# Patient Record
Sex: Female | Born: 1955 | Race: Black or African American | Hispanic: No | Marital: Single | Smoking: Former smoker
Health system: Southern US, Community
[De-identification: ages and names within clinical notes are randomized; demographics above are authoritative.]

## PROBLEM LIST (undated history)

## (undated) DIAGNOSIS — I2510 Atherosclerotic heart disease of native coronary artery without angina pectoris: Secondary | ICD-10-CM

## (undated) DIAGNOSIS — E785 Hyperlipidemia, unspecified: Secondary | ICD-10-CM

## (undated) DIAGNOSIS — I10 Essential (primary) hypertension: Secondary | ICD-10-CM

## (undated) DIAGNOSIS — E079 Disorder of thyroid, unspecified: Secondary | ICD-10-CM

## (undated) DIAGNOSIS — I219 Acute myocardial infarction, unspecified: Secondary | ICD-10-CM

## (undated) HISTORY — PX: CARDIAC CATHETERIZATION: SHX172

## (undated) HISTORY — PX: ABDOMINAL HYSTERECTOMY: SHX81

---

## 2017-02-27 ENCOUNTER — Observation Stay (HOSPITAL_COMMUNITY)
Admission: EM | Admit: 2017-02-27 | Discharge: 2017-02-27 | Disposition: A | Discharge status: Home / Self Care | Payer: Medicare HMO | Attending: Internal Medicine | Admitting: Internal Medicine

## 2017-02-27 ENCOUNTER — Emergency Department (HOSPITAL_COMMUNITY): Payer: Medicare HMO

## 2017-02-27 ENCOUNTER — Encounter (HOSPITAL_COMMUNITY): Payer: Self-pay | Admitting: Emergency Medicine

## 2017-02-27 DIAGNOSIS — Z720 Tobacco use: Secondary | ICD-10-CM | POA: Diagnosis not present

## 2017-02-27 DIAGNOSIS — R011 Cardiac murmur, unspecified: Secondary | ICD-10-CM | POA: Diagnosis not present

## 2017-02-27 DIAGNOSIS — Z7984 Long term (current) use of oral hypoglycemic drugs: Secondary | ICD-10-CM | POA: Diagnosis not present

## 2017-02-27 DIAGNOSIS — Z7982 Long term (current) use of aspirin: Secondary | ICD-10-CM | POA: Insufficient documentation

## 2017-02-27 DIAGNOSIS — I25110 Atherosclerotic heart disease of native coronary artery with unstable angina pectoris: Secondary | ICD-10-CM

## 2017-02-27 DIAGNOSIS — D649 Anemia, unspecified: Secondary | ICD-10-CM

## 2017-02-27 DIAGNOSIS — E119 Type 2 diabetes mellitus without complications: Secondary | ICD-10-CM

## 2017-02-27 DIAGNOSIS — I252 Old myocardial infarction: Secondary | ICD-10-CM

## 2017-02-27 DIAGNOSIS — R0602 Shortness of breath: Secondary | ICD-10-CM | POA: Diagnosis not present

## 2017-02-27 DIAGNOSIS — Z79899 Other long term (current) drug therapy: Secondary | ICD-10-CM | POA: Diagnosis not present

## 2017-02-27 DIAGNOSIS — R079 Chest pain, unspecified: Principal | ICD-10-CM

## 2017-02-27 DIAGNOSIS — I209 Angina pectoris, unspecified: Secondary | ICD-10-CM

## 2017-02-27 DIAGNOSIS — Z885 Allergy status to narcotic agent status: Secondary | ICD-10-CM

## 2017-02-27 DIAGNOSIS — I2510 Atherosclerotic heart disease of native coronary artery without angina pectoris: Secondary | ICD-10-CM

## 2017-02-27 DIAGNOSIS — Z888 Allergy status to other drugs, medicaments and biological substances status: Secondary | ICD-10-CM

## 2017-02-27 DIAGNOSIS — I10 Essential (primary) hypertension: Secondary | ICD-10-CM

## 2017-02-27 DIAGNOSIS — Z8249 Family history of ischemic heart disease and other diseases of the circulatory system: Secondary | ICD-10-CM

## 2017-02-27 DIAGNOSIS — F17211 Nicotine dependence, cigarettes, in remission: Secondary | ICD-10-CM

## 2017-02-27 DIAGNOSIS — Z886 Allergy status to analgesic agent status: Secondary | ICD-10-CM

## 2017-02-27 DIAGNOSIS — E785 Hyperlipidemia, unspecified: Secondary | ICD-10-CM

## 2017-02-27 DIAGNOSIS — I249 Acute ischemic heart disease, unspecified: Secondary | ICD-10-CM | POA: Diagnosis not present

## 2017-02-27 HISTORY — DX: Essential (primary) hypertension: I10

## 2017-02-27 HISTORY — DX: Acute myocardial infarction, unspecified: I21.9

## 2017-02-27 HISTORY — DX: Hyperlipidemia, unspecified: E78.5

## 2017-02-27 HISTORY — DX: Disorder of thyroid, unspecified: E07.9

## 2017-02-27 HISTORY — DX: Atherosclerotic heart disease of native coronary artery without angina pectoris: I25.10

## 2017-02-27 LAB — CBC
HCT: 35.6 % — ABNORMAL LOW (ref 36.0–46.0)
Hemoglobin: 11.7 g/dL — ABNORMAL LOW (ref 12.0–15.0)
MCH: 29.3 pg (ref 26.0–34.0)
MCHC: 32.9 g/dL (ref 30.0–36.0)
MCV: 89.2 fL (ref 78.0–100.0)
PLATELETS: 243 10*3/uL (ref 150–400)
RBC: 3.99 MIL/uL (ref 3.87–5.11)
RDW: 12.6 % (ref 11.5–15.5)
WBC: 6.3 10*3/uL (ref 4.0–10.5)

## 2017-02-27 LAB — BASIC METABOLIC PANEL
Anion gap: 11 (ref 5–15)
BUN: 8 mg/dL (ref 6–20)
CALCIUM: 8.7 mg/dL — AB (ref 8.9–10.3)
CO2: 21 mmol/L — ABNORMAL LOW (ref 22–32)
CREATININE: 0.66 mg/dL (ref 0.44–1.00)
Chloride: 104 mmol/L (ref 101–111)
Glucose, Bld: 115 mg/dL — ABNORMAL HIGH (ref 65–99)
Potassium: 3.9 mmol/L (ref 3.5–5.1)
SODIUM: 136 mmol/L (ref 135–145)

## 2017-02-27 LAB — I-STAT TROPONIN, ED
TROPONIN I, POC: 0 ng/mL (ref 0.00–0.08)
TROPONIN I, POC: 0 ng/mL (ref 0.00–0.08)

## 2017-02-27 LAB — TROPONIN I

## 2017-02-27 LAB — CBG MONITORING, ED: GLUCOSE-CAPILLARY: 113 mg/dL — AB (ref 65–99)

## 2017-02-27 LAB — GLUCOSE, CAPILLARY: GLUCOSE-CAPILLARY: 153 mg/dL — AB (ref 65–99)

## 2017-02-27 MED ORDER — NITROGLYCERIN 2 % TD OINT
1.0000 [in_us] | TOPICAL_OINTMENT | Freq: Four times a day (QID) | TRANSDERMAL | Status: DC
  Administered 2017-02-27: 1 [in_us] via TOPICAL
  Filled 2017-02-27: qty 1

## 2017-02-27 MED ORDER — MORPHINE SULFATE (PF) 4 MG/ML IV SOLN
4.0000 mg | Freq: Once | INTRAVENOUS | Status: AC
  Administered 2017-02-27: 4 mg via INTRAVENOUS
  Filled 2017-02-27: qty 1

## 2017-02-27 MED ORDER — HEPARIN SODIUM (PORCINE) 5000 UNIT/ML IJ SOLN: 5000.0000 [IU] | Freq: Three times a day (TID) | INTRAMUSCULAR | Status: DC

## 2017-02-27 MED ORDER — IRBESARTAN 150 MG PO TABS: 150.0000 mg | ORAL_TABLET | Freq: Every day | ORAL | Status: DC

## 2017-02-27 MED ORDER — AMLODIPINE BESYLATE 5 MG PO TABS
5.0000 mg | ORAL_TABLET | Freq: Every day | ORAL | Status: DC
Start: 2017-02-28 — End: 2017-02-27

## 2017-02-27 MED ORDER — METOPROLOL SUCCINATE ER 50 MG PO TB24
50.0000 mg | ORAL_TABLET | Freq: Every day | ORAL | Status: DC
Start: 2017-02-28 — End: 2017-02-27

## 2017-02-27 MED ORDER — EZETIMIBE 10 MG PO TABS: 10.0000 mg | ORAL_TABLET | Freq: Every day | ORAL | Status: DC

## 2017-02-27 MED ORDER — VENLAFAXINE HCL ER 150 MG PO CP24: 225.0000 mg | ORAL_CAPSULE | Freq: Every day | ORAL | Status: DC

## 2017-02-27 MED ORDER — NITROGLYCERIN 0.4 MG SL SUBL: 0.4000 mg | SUBLINGUAL_TABLET | SUBLINGUAL | 0 refills | Status: AC | PRN

## 2017-02-27 MED ORDER — LEVOTHYROXINE SODIUM 88 MCG PO TABS: 88.0000 ug | ORAL_TABLET | Freq: Every day | ORAL | Status: DC

## 2017-02-27 MED ORDER — BUPROPION HCL ER (XL) 150 MG PO TB24: 300.0000 mg | ORAL_TABLET | Freq: Every day | ORAL | Status: DC

## 2017-02-27 NOTE — Progress Notes (Signed)
Seen by cards. Dr. Caron PresumeHelberg notified via text messaging.

## 2017-02-27 NOTE — ED Provider Notes (Signed)
[facility] EMERGENCY DEPARTMENT Provider Note   CSN: [id number] Arrival date & time: 02/27/17  0510     History   Chief Complaint Chief Complaint  Patient presents with  . Chest Pain    HPI Danielle Glass is a 61 y.o. female.  HPI  Patient comes in with chief complaint of chest pain. Patient has history of hyperlipidemia, hypertension, diabetes.  She reports a nonobstructive cath with coronary artery disease 2 years ago, done at [facility] in [place].  Patient reports that she woke up in the middle night with chest discomfort.  Patient's chest pain is described as heaviness type pain on the left side with radiation to the neck and shoulder.  Patient has had associated shortness of breath, and she was clammy.     Past Medical History:  Diagnosis Date  . Coronary artery disease   . Heart attack (HCC)   . Hyperlipidemia   . Hypertension   . Thyroid disease     There are no active problems to display for this patient.   Past Surgical History:  Procedure Laterality Date  . ABDOMINAL HYSTERECTOMY    . CARDIAC CATHETERIZATION      OB History    No data available       Home Medications    Prior to Admission medications   Medication Sig Start Date End Date Taking? Authorizing Provider  Alirocumab (PRALUENT) 150 MG/ML SOPN Inject 1 mL into the skin every 14 (fourteen) days.   Yes [provider]  amLODipine (NORVASC) 5 MG tablet Take 5 mg by mouth daily.   Yes [provider]  ammonium lactate (AMLACTIN) 12 % cream Apply 1 g topically daily as needed for dry skin.   Yes [provider]  aspirin EC 81 MG tablet Take 81 mg by mouth daily.   Yes [provider]  betamethasone valerate (VALISONE) 0.1 % cream Apply 1 application topically daily as needed (psoriasis).   Yes [provider]  buPROPion (WELLBUTRIN XL) 300 MG 24 hr tablet Take 300 mg by mouth daily.   Yes [provider]    candesartan (ATACAND) 16 MG tablet Take 16 mg by mouth daily.   Yes [provider]  Diclofenac Sodium (PENNSAID) 2 % SOLN Place 1 Pump onto the skin daily as needed (pain).   Yes [provider]  esomeprazole (NEXIUM) 40 MG capsule Take 40 mg by mouth daily at 12 noon.   Yes [provider]  estrogens, conjugated, (PREMARIN) 0.9 MG tablet Take 0.9 mg by mouth daily. Take daily for 21 days then do not take for 7 days.   Yes [provider]  ezetimibe (ZETIA) 10 MG tablet Take 10 mg by mouth daily.   Yes [provider]  levothyroxine (SYNTHROID, LEVOTHROID) 88 MCG tablet Take 88 mcg by mouth daily before breakfast.   Yes [provider]  metoprolol succinate (TOPROL-XL) 50 MG 24 hr tablet Take 50 mg by mouth daily. Take with or immediately following a meal.   Yes [provider]  pregabalin (LYRICA) 100 MG capsule Take 100 mg by mouth 4 (four) times daily.   Yes [provider]  tiZANidine (ZANAFLEX) 4 MG tablet Take 4 mg by mouth every 6 (six) hours as needed for muscle spasms.   Yes [provider]  venlafaxine XR (EFFEXOR-XR) 75 MG 24 hr capsule Take 225 mg by mouth daily with breakfast.   Yes [provider]    Family History  History reviewed. No pertinent family history.  Social History Social History   Tobacco Use  . Smoking status: Former Smoker    Last attempt to quit: 08/28/2016    Years since quitting: 0.5  . Smokeless tobacco: Never Used  Substance Use Topics  . Alcohol use: No    Frequency: Never  . Drug use: No     Allergies   Ace inhibitors; Statins; Asa [aspirin]; Codeine; Metformin and related; and Nsaids   Review of Systems Review of Systems  Constitutional: Positive for activity change and diaphoresis.  Respiratory: Positive for shortness of breath.   Cardiovascular: Positive for chest pain.  Gastrointestinal: Negative for abdominal pain.  Allergic/Immunologic: Negative  for immunocompromised state.     Physical Exam Updated Vital Signs BP (!) 155/79   Pulse 71   Temp 98 F (36.7 C) (Oral)   Resp 19   Ht 5\' 8"  (1.727 m)   Wt 77.1 kg (170 lb)   SpO2 98%   BMI 25.85 kg/m   Physical Exam  Constitutional: She is oriented to person, place, and time. She appears well-developed.  HENT:  Head: Normocephalic and atraumatic.  Eyes: EOM are normal.  Neck: Normal range of motion. Neck supple.  Cardiovascular: Normal rate, regular rhythm, intact distal pulses and normal pulses.  Pulmonary/Chest: Effort normal.  Abdominal: Bowel sounds are normal.  Musculoskeletal:       Right lower leg: She exhibits no tenderness and no edema.       Left lower leg: She exhibits no tenderness and no edema.  Neurological: She is alert and oriented to person, place, and time.  Skin: Skin is warm and dry.  Nursing note and vitals reviewed.    ED Treatments / Results  Labs (all labs ordered are listed, but only abnormal results are displayed) Labs Reviewed  BASIC METABOLIC PANEL - Abnormal; Notable for the following components:      Result Value   CO2 21 (*)    Glucose, Bld 115 (*)    Calcium 8.7 (*)    All other components within normal limits  CBC - Abnormal; Notable for the following components:   Hemoglobin 11.7 (*)    HCT 35.6 (*)    All other components within normal limits  CBG MONITORING, ED - Abnormal; Notable for the following components:   Glucose-Capillary 113 (*)    All other components within normal limits  I-STAT TROPONIN, ED    EKG  EKG Interpretation  Date/Time:  Tuesday February 27 2017 05:20:06 EST Ventricular Rate:  76 PR Interval:    QRS Duration: 83 QT Interval:  380 QTC Calculation: 428 R Axis:   58 Text Interpretation:  Sinus rhythm Borderline prolonged PR interval Left atrial enlargement No acute changes No old tracing to compare Confirmed by Derwood KaplanNanavati, Emmarae Cowdery [phone]([id number]) on 02/27/2017 5:55:32 AM        EKG  Interpretation  Date/Time:  Tuesday February 27 2017 07:02:03 EST Ventricular Rate:  75 PR Interval:    QRS Duration: 81 QT Interval:  390 QTC Calculation: 436 R Axis:   65 Text Interpretation:  Sinus rhythm Probable left atrial enlargement Nonspecific ST abnormality No acute changes Confirmed by Derwood KaplanNanavati, Zoua Caporaso [phone]([id number]) on 02/27/2017 7:05:13 AM       Radiology Dg Chest Portable 1 View  Result Date: 02/27/2017 CLINICAL DATA:  Acute onset of generalized chest pain. EXAM: PORTABLE CHEST 1 VIEW COMPARISON:  None. FINDINGS: The lungs are well-aerated and clear. There is no evidence of focal opacification, pleural  effusion or pneumothorax. The cardiomediastinal silhouette is within normal limits. No acute osseous abnormalities are seen. IMPRESSION: No acute cardiopulmonary process seen. Electronically Signed   By: Roanna RaiderJeffery  Chang M.D.   On: 02/27/2017 06:18    Procedures Procedures (including critical care time)  Medications Ordered in ED Medications  nitroGLYCERIN (NITROGLYN) 2 % ointment 1 inch (1 inch Topical Given 02/27/17 0632)  morphine 4 MG/ML injection 4 mg (4 mg Intravenous Given 02/27/17 0646)     Initial Impression / Assessment and Plan / ED Course  I have reviewed the triage vital signs and the nursing notes.  Pertinent labs & imaging results that were available during my care of the patient were reviewed by me and considered in my medical decision making (see chart for details).  Clinical Course as of Feb 28 743  Tue Feb 27, 2017  0705 Patient is chest pain returned. Repeat EKG is reassuring.  Admit to medicine  [AN]    Clinical Course User Index [AN] Derwood KaplanNanavati, Joziah Dollins, MD    Patient with history of hypertension, hyperlipidemia, diabetes comes in with chief complaint of chest discomfort. Patient's chest pain is typical.  Patient is still having mild chest discomfort at this time.  Nitroglycerin ordered.  Patient received full dose aspirin per EMS. EKG is  reassuring.  Heart score is 5. Anticipate admission.  Final Clinical Impressions(s) / ED Diagnoses   Final diagnoses:  Angina pectoris [facility](HCC)    ED Discharge Orders    None       Derwood KaplanNanavati, Tayte Childers, MD 02/27/17 [id number]

## 2017-02-27 NOTE — ED Notes (Signed)
Delay in lab draw,  Xray in room. 

## 2017-02-27 NOTE — Discharge Summary (Signed)
Name: Danielle HanlonJanice Glass MRN: [id number] DOB: May 22, 1955 61 y.o. PCP: System, Pcp Not In  Date of Admission: 02/27/2017  5:10 AM Date of Discharge:  Attending Physician: Gust RungHoffman, Erik C, DO  Discharge Diagnosis: 1. Chest Pain   Active Problems:   Chest pain with moderate risk for cardiac etiology  Discharge Medications: Allergies as of 02/27/2017      Reactions   Ace Inhibitors Cough   Statins Other (See Comments)   Muscle Pain   Asa [aspirin] Nausea And Vomiting   Codeine Itching   Metformin And Related Diarrhea   Nsaids Other (See Comments)   Bleeding Ulcer History      Medication List    TAKE these medications   amLODipine 5 MG tablet Commonly known as:  NORVASC Take 5 mg by mouth daily.   ammonium lactate 12 % cream Commonly known as:  AMLACTIN Apply 1 g topically daily as needed for dry skin.   aspirin EC 81 MG tablet Take 81 mg by mouth daily.   betamethasone valerate 0.1 % cream Commonly known as:  VALISONE Apply 1 application topically daily as needed (psoriasis).   buPROPion 300 MG 24 hr tablet Commonly known as:  WELLBUTRIN XL Take 300 mg by mouth daily.   candesartan 16 MG tablet Commonly known as:  ATACAND Take 16 mg by mouth daily.   esomeprazole 40 MG capsule Commonly known as:  NEXIUM Take 40 mg by mouth daily at 12 noon.   estrogens (conjugated) 0.9 MG tablet Commonly known as:  PREMARIN Take 0.9 mg by mouth daily. Take daily for 21 days then do not take for 7 days.   ezetimibe 10 MG tablet Commonly known as:  ZETIA Take 10 mg by mouth daily.   levothyroxine 88 MCG tablet Commonly known as:  SYNTHROID, LEVOTHROID Take 88 mcg by mouth daily before breakfast.   metoprolol succinate 50 MG 24 hr tablet Commonly known as:  TOPROL-XL Take 50 mg by mouth daily. Take with or immediately following a meal.   nitroGLYCERIN 0.4 MG SL tablet Commonly known as:  NITROSTAT Place 1 tablet (0.4 mg total) under the tongue every 5 (five)  minutes as needed for chest pain.   PENNSAID 2 % Soln Generic drug:  Diclofenac Sodium Place 1 Pump onto the skin daily as needed (pain).   PRALUENT 150 MG/ML Sopn Generic drug:  Alirocumab Inject 1 mL into the skin every 14 (fourteen) days.   pregabalin 100 MG capsule Commonly known as:  LYRICA Take 100 mg by mouth 4 (four) times daily.   tiZANidine 4 MG tablet Commonly known as:  ZANAFLEX Take 4 mg by mouth every 6 (six) hours as needed for muscle spasms.   venlafaxine XR 75 MG 24 hr capsule Commonly known as:  EFFEXOR-XR Take 225 mg by mouth daily with breakfast.      Disposition and follow-up:   [facility]Danielle Glass [facility].  At the hospital follow up visit please address:  1.  Evaluate whether the patient was able to follow-up with cardiology and whether she has experienced any more chest pain. Continue to encourage tobacco cessation and medication compliance.   2.  Labs / imaging needed at time of follow-up: None  3.  Pending labs/ test needing follow-up: None  Hospital Course by problem list: Active Problems:   Chest pain with moderate risk for cardiac etiology   1. Chest Pain. Danielle Glass is a 61 y.o female presenting with signs  and symptoms of ACS. Risk factors for CAD include tobacco use, HTN (controlled with Amlodipine 5 mg QD, candesartan 16 mg QD, and Metoprolol 50 mg QD), HLD, DM (recent A1c 5.9). She recently had a cardiac cath in 10/2014 that illustrated non-obstructive CAD with 30% stenosis of the circ but otherwise normal coronary arteries. Troponin was negative and EKG showed no ST elevation, T wave inversion, or PR depression. We consulted cardiology who felt this was unlikely to be cardiac chest pain and that the patient was stable for discharge with follow-up with her cardiologist in [place], [place]. We encouraged continued medication compliance, tobacco cessation, and provided her a prescription  for sublingual nitro. We discussed the plan for with the patient. She agreed with the plan. Return precautions given. All questions and concerns addressed. No other medication changes were made.   Discharge Vitals:   BP (!) 145/88 (BP Location: Left Arm)   Pulse 71   Temp 97.8 F (36.6 C) (Oral)   Resp 17   Ht 5\' 8"  (1.727 m)   Wt 171 lb 3.2 oz (77.7 kg)   SpO2 99%   BMI 26.03 kg/m   Discharge Instructions: Discharge Instructions    Call MD for:  difficulty breathing, headache or visual disturbances   Complete by:  As directed    Call MD for:  persistant nausea and vomiting   Complete by:  As directed    Call MD for:  severe uncontrolled pain   Complete by:  As directed    Diet - low sodium heart healthy   Complete by:  As directed    Increase activity slowly   Complete by:  As directed      Signed: Levora DredgeHelberg, Matin Mattioli, MD 02/27/2017, 2:25 PM   My Pager: [phone]

## 2017-02-27 NOTE — Discharge Instructions (Signed)
Thank you for allowing us to provide your care. Please follow-up with your cardiologist and primary care doctor as soon as possible.

## 2017-02-27 NOTE — Plan of Care (Signed)
Oriented to unit. Denies CP. VSS. No further  cardiac work up needed per cards. Plan D/C home.

## 2017-02-27 NOTE — ED Triage Notes (Signed)
Patient arrives from home via EMS. States she was woken up by chest pressure this AM around 0330. States she got up and waited a little while, but pain persisted and became worse. 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 Denies stents or CABG. EMS administered 324mg  ASA and 2x 0.4mg  SL NTG PTA. Initial pain rating for EMS was 7, after medications rating at 2. Upon encounter patient still states pain @ 2 and describes as pressure with persistent radiation towards throat. Also endorses nausea at this time. Explains that initially pain was stabbing in nature with radiation to left arm and left neck. Confirms that sensations are similar to previous MI.

## 2017-02-27 NOTE — ED Notes (Signed)
Attempted report x1. 

## 2017-02-27 NOTE — H&P (Signed)
Date: 02/27/2017               Patient Name:  Danielle HanlonJanice Glass MRN: [id number]  DOB: 09-Dec-1955 Age / Sex: 61 y.o., female   PCP: System, Pcp Not In         Medical Service: Internal Medicine Teaching Service         Attending Physician: Dr. Mikey BussingHoffman, Danielle RollingErik C, DO    First Contact: Dr. Caron PresumeHelberg Pager: [phone]  Second Contact: Dr. Obie DredgeBlum Pager: [phone]       After Hours (After 5p/  First Contact Pager: [phone]  weekends / holidays): Second Contact Pager: [phone]   Chief Complaint: Chest Pain  History of Present Illness: Danielle HanlonJanice Lacount is a 61 y.o female with controlled HTN, controlled DM, HLD, tobacco abuse and CAD (cath in 8/16 illustrating non-obstructive CAD with 30% stenosis of the circ but otherwise normal coronary arteries) who presented to the ED with midsternal chest pressure radiating to the jaw and left arm since 0300 this morning. She awoke with midsternal chest pressure and stabbing pain that radiated to the left arm and jaw with diaphoresis, SOB, and nausea. She got up and went to get a drink of water but did not appreciate any changes in the consistency of her pain. When EMS arrived she was given nitro which significant improved her pain, she now has nitro paste on which is helping. Her last MI was >10 years ago but she feels this is very similar. She follows with outpatient cardiology, [facility] Beryle [facility]ubh [facility]. 3 years ago she reports and abnormal stress test and subsequently underwent cardiac cath that illustrated non-obstructive CAD with 30% stenosis of the circ but otherwise normal coronary arteries. She does have angina with intense physical activity including running on a treadmill and the pain subsides with rest. She does not have nitro at home. She states that the pain this AM was worse than the pain she experiences with intense exercise. She denies changes in the pain with deep inspiration or position. The pain does not radiate  to her back, she has not been sick recently, denies cough, abdominal pain, diarrhea, dysuria, polyuria. Family history is significant for early CAD in her mother 5(40s) and her maternal aunts/uncles.   Meds:  Current Meds  Medication Sig  . Alirocumab (PRALUENT) 150 MG/ML SOPN Inject 1 mL into the skin every 14 (fourteen) days.  Marland Kitchen. amLODipine (NORVASC) 5 MG tablet Take 5 mg by mouth daily.  Marland Kitchen. ammonium lactate (AMLACTIN) 12 % cream Apply 1 g topically daily as needed for dry skin.  Marland Kitchen. aspirin EC 81 MG tablet Take 81 mg by mouth daily.  . betamethasone valerate (VALISONE) 0.1 % cream Apply 1 application topically daily as needed (psoriasis).  Marland Kitchen. buPROPion (WELLBUTRIN XL) 300 MG 24 hr tablet Take 300 mg by mouth daily.  . candesartan (ATACAND) 16 MG tablet Take 16 mg by mouth daily.  . Diclofenac Sodium (PENNSAID) 2 % SOLN Place 1 Pump onto the skin daily as needed (pain).  Marland Kitchen. esomeprazole (NEXIUM) 40 MG capsule Take 40 mg by mouth daily at 12 noon.  . estrogens, conjugated, (PREMARIN) 0.9 MG tablet Take 0.9 mg by mouth daily. Take daily for 21 days then do not take for 7 days.  Marland Kitchen. ezetimibe (ZETIA) 10 MG tablet Take 10 mg by mouth daily.  Marland Kitchen. levothyroxine (SYNTHROID, LEVOTHROID) 88 MCG tablet Take 88 mcg by mouth daily before breakfast.  . metoprolol succinate (TOPROL-XL) 50  MG 24 hr tablet Take 50 mg by mouth daily. Take with or immediately following a meal.  . pregabalin (LYRICA) 100 MG capsule Take 100 mg by mouth 4 (four) times daily.  Marland Kitchen tiZANidine (ZANAFLEX) 4 MG tablet Take 4 mg by mouth every 6 (six) hours as needed for muscle spasms.  Marland Kitchen venlafaxine XR (EFFEXOR-XR) 75 MG 24 hr capsule Take 225 mg by mouth daily with breakfast.   Allergies: Allergies as of 02/27/2017 - Review Complete 02/27/2017  Allergen Reaction Noted  . Ace inhibitors Cough 02/27/2017  . Statins Other (See Comments) 02/27/2017  . Asa [aspirin] Nausea And Vomiting 02/27/2017  . Codeine Itching 02/27/2017  . Metformin and  related Diarrhea 02/27/2017  . Nsaids Other (See Comments) 02/27/2017   Past Medical History:  Diagnosis Date  . Coronary artery disease   . Heart attack (HCC)   . Hyperlipidemia   . Hypertension   . Thyroid disease    Family History:  Early CAD in her mother (42s) and her maternal aunts/uncles  Social History:  Stopped smoking 6 months ago  Denies the use of EtOH or illicit drug use  Review of Systems: A complete ROS was negative except as per HPI.   Physical Exam: Blood pressure (!) 155/88, pulse 69, temperature 98 F (36.7 C), temperature source Oral, resp. rate 17, height 5\' 8"  (1.727 m), weight 170 lb (77.1 kg), SpO2 97 %.  General: Well nourished female in no acute distress HENT: Normocephalic, atraumatic, moist mucus membranes  Pulm: Good air movement with no wheezing or crackles  CV: RRR, early systolic murmur, no rubs  Abdomen: Active bowel sounds, soft, non-distended, no tenderness to palpation  Extremities: No LE edema, pulses palpable in all extremities Skin: Warm and dry  Neuro: Alert and oriented x 3  EKG: personally reviewed: my interpretation is sinus rhythm with normal axis and borderline PR prolongation. No ST elevate, T wave inversion, or PR depression.  CXR: personally reviewed: my interpretation is goo penetration and rotation. No bony or soft tissue abnormalities. Lung appear hyperinflated but otherwise clear. No cardiomegaly. No pleural effusions.   Assessment & Plan by Problem: Active Problems:   Chest pain with moderate risk for cardiac etiology  ACS / Unstable Angina. Lillyanna Glandon is a 61 y.o female presenting with signs and symptoms of ACS. Risk factors for CAD include tobacco use, HTN (controlled with Amlodipine 5 mg QD, candesartan 16 mg QD, and Metoprolol 50 mg QD), HLD, DM (recent A1c 5.9). She recently had a cardiac cath in 10/2014 that illustrated non-obstructive CAD with 30% stenosis of the circ but otherwise normal coronary arteries.  Initial troponin is negative and EKG showing no ST elevation, T wave inversion, or PR depression. We have consulted cariology. Patient is otherwise stable and will be admitted for observation and continued trending of her troponin.  - Consult cardiology  - Repeat EKG in the AM  - Trend Troponin   Normocytic Anemia  - Hgb 11.7 with normal RDW  - Most recent labs from 2016 showing Hgb of >13 - Will need further assessment as an outpatient  DM, controlled - Home medications include Sitagliptin 100 mg QD - SSI while in the hospital   HTN, controlled  - Continue Amlodipine 5 mg, Candesartan 16 mg QD, and Metoprolol 50 mg QD  Tobacco Abuse - Patient quit smoking 6 months ago  - Can add nicotine patch if needed  Diet: Heart healthy + carb modified VTE ppx: SubQ Heparin  Code Status: Full  code  Dispo: Admit patient to Observation with expected length of stay less than 2 midnights.  Signed: Levora DredgeHelberg, Briyana Badman, MD 02/27/2017, 8:58 AM  My Pager: [phone]

## 2017-02-27 NOTE — Consult Note (Signed)
Cardiology Consultation:   Patient ID: Danielle Glass; [id number]; 20-Aug-1955   Admit date: 02/27/2017 Date of Consult: 02/27/2017  Primary Care Provider: System, Pcp Not In Primary Cardiologist:  Langston Masker, MD [facility]) Primary Electrophysiologist:  none   Patient Profile:   Danielle Glass is a 61 y.o. female with a hx of nonobstructive CAD, hypertension, hyperlipidemia who is being seen today for the evaluation of chest pain at the request of Dr. Mikey Bussing.  History of Present Illness:   Ms. Danielle Glass 61 year old female who sees cardiology in [facility] with cardiac catheterization in 2016 showing 30% nonobstructive coronary artery disease in the ostial circumflex otherwise normal coronary arteries with normal ejection fraction, mild MR, mild TR on echocardiogram here with an episode of chest discomfort that awoken her from sleep.  She is visiting her sister.  Approximate 3 AM this morning she woke up and felt a sharp central chest discomfort that then migrated into a pressure-like discomfort with sweating.  This was concerning to her, she got up try to drink some water to see if she could gain relief and the pressure continued.  Because of this, she came to the hospital for further evaluation.  Here, her troponins have remained normal, EKG is unremarkable with no evidence of ischemia or pericarditis.  She is currently sitting up in bed feeling comfortable, eating her breakfast/lunch ambulating well without difficulty.  She denies any recent fevers chills nausea vomiting syncope bleeding.  Past Medical History:  Diagnosis Date  . Coronary artery disease   . Heart attack (HCC)   . Hyperlipidemia   . Hypertension   . Thyroid disease     Past Surgical History:  Procedure Laterality Date  . ABDOMINAL HYSTERECTOMY    . CARDIAC CATHETERIZATION       Home Medications:  Prior to Admission medications   Medication Sig Start Date End Date Taking?  Authorizing Provider  Alirocumab (PRALUENT) 150 MG/ML SOPN Inject 1 mL into the skin every 14 (fourteen) days.   Yes [provider]  amLODipine (NORVASC) 5 MG tablet Take 5 mg by mouth daily.   Yes [provider]  ammonium lactate (AMLACTIN) 12 % cream Apply 1 g topically daily as needed for dry skin.   Yes [provider]  aspirin EC 81 MG tablet Take 81 mg by mouth daily.   Yes [provider]  betamethasone valerate (VALISONE) 0.1 % cream Apply 1 application topically daily as needed (psoriasis).   Yes [provider]  buPROPion (WELLBUTRIN XL) 300 MG 24 hr tablet Take 300 mg by mouth daily.   Yes [provider]  candesartan (ATACAND) 16 MG tablet Take 16 mg by mouth daily.   Yes [provider]  Diclofenac Sodium (PENNSAID) 2 % SOLN Place 1 Pump onto the skin daily as needed (pain).   Yes [provider]  esomeprazole (NEXIUM) 40 MG capsule Take 40 mg by mouth daily at 12 noon.   Yes [provider]  estrogens, conjugated, (PREMARIN) 0.9 MG tablet Take 0.9 mg by mouth daily. Take daily for 21 days then do not take for 7 days.   Yes [provider]  ezetimibe (ZETIA) 10 MG tablet Take 10 mg by mouth daily.   Yes [provider]  levothyroxine (SYNTHROID, LEVOTHROID) 88 MCG tablet Take 88 mcg by mouth daily before breakfast.   Yes [provider]  metoprolol succinate (TOPROL-XL) 50 MG 24 hr tablet Take 50 mg by mouth daily. Take with or  immediately following a meal.   Yes [provider]  pregabalin (LYRICA) 100 MG capsule Take 100 mg by mouth 4 (four) times daily.   Yes [provider]  tiZANidine (ZANAFLEX) 4 MG tablet Take 4 mg by mouth every 6 (six) hours as needed for muscle spasms.   Yes [provider]  venlafaxine XR (EFFEXOR-XR) 75 MG 24 hr capsule Take 225 mg by mouth daily with breakfast.   Yes [provider]    Inpatient  Medications: Scheduled Meds: . [START ON 02/28/2017] amLODipine  5 mg Oral Daily  . [START ON 02/28/2017] buPROPion  300 mg Oral Daily  . [START ON 02/28/2017] ezetimibe  10 mg Oral Daily  . heparin  5,000 Units Subcutaneous Q8H  . irbesartan  150 mg Oral Daily  . [START ON 02/28/2017] levothyroxine  88 mcg Oral QAC breakfast  . [START ON 02/28/2017] metoprolol succinate  50 mg Oral Daily  . nitroGLYCERIN  1 inch Topical Q6H  . [START ON 02/28/2017] venlafaxine XR  225 mg Oral Q breakfast   Continuous Infusions:  PRN Meds:   Allergies:    Allergies  Allergen Reactions  . Ace Inhibitors Cough  . Statins Other (See Comments)    Muscle Pain  . Asa [Aspirin] Nausea And Vomiting  . Codeine Itching  . Metformin And Related Diarrhea  . Nsaids Other (See Comments)    Bleeding Ulcer History    Social History:   Social History   Socioeconomic History  . Marital status: Single    Spouse name: Not on file  . Number of children: Not on file  . Years of education: Not on file  . Highest education level: Not on file  Social Needs  . Financial resource strain: Not on file  . Food insecurity - worry: Not on file  . Food insecurity - inability: Not on file  . Transportation needs - medical: Not on file  . Transportation needs - non-medical: Not on file  Occupational History  . Not on file  Tobacco Use  . Smoking status: Former Smoker    Last attempt to quit: 08/28/2016    Years since quitting: 0.5  . Smokeless tobacco: Never Used  Substance and Sexual Activity  . Alcohol use: No    Frequency: Never  . Drug use: No  . Sexual activity: Not on file  Other Topics Concern  . Not on file  Social History Narrative  . Not on file    Family History:    No early CAD   ROS:  Please see the history of present illness.  ROS  All other ROS reviewed and negative.     Physical Exam/Data:   Vitals:   02/27/17 0815 02/27/17 0830 02/27/17 0900 02/27/17 0951  BP:  (!) 155/88 (!)  147/95 (!) 165/101  Pulse: 77 69 74 67  Resp: 20 17 17 17   Temp:    97.9 F (36.6 C)  TempSrc:    Oral  SpO2: 97% 97% 98% 99%  Weight:    171 lb 3.2 oz (77.7 kg)  Height:    5\' 8"  (1.727 m)   No intake or output data in the 24 hours ending 02/27/17 1140 Filed Weights   02/27/17 0526 02/27/17 0951  Weight: 170 lb (77.1 kg) 171 lb 3.2 oz (77.7 kg)   Body mass index is 26.03 kg/m.  General:  Well nourished, well developed, in no acute distress HEENT: normal Lymph: no adenopathy Neck: no JVD Endocrine:  No  thryomegaly Vascular: No carotid bruits; FA pulses 2+ bilaterally without bruits  Cardiac:  normal S1, S2; RRR; no murmur  Lungs:  clear to auscultation bilaterally, no wheezing, rhonchi or rales  Abd: soft, nontender, no hepatomegaly  Ext: no edema Musculoskeletal:  No deformities, BUE and BLE strength normal and equal Skin: warm and dry  Neuro:  CNs 2-12 intact, no focal abnormalities noted Psych:  Normal affect   EKG:  The EKG was personally reviewed and demonstrates: Sinus rhythm with no ST segment changes. Telemetry:  Telemetry was personally reviewed and demonstrates: Sinus rhythm with no abnormalities  Relevant CV Studies:  Relevant past cardiac records/studies were reviewed:  TTE (09/22/2014): LVEF , normal RV, mild MR, mild TR  Coronary angiography (10/05/2014): Cx - ostial 30 %, normal coronaries otherwise   Laboratory Data:  Chemistry Recent Labs  Lab 02/27/17 0603  NA 136  K 3.9  CL 104  CO2 21*  GLUCOSE 115*  BUN 8  CREATININE 0.66  CALCIUM 8.7*  GFRNONAA >60  GFRAA >60  ANIONGAP 11    No results for input(s): PROT, ALBUMIN, AST, ALT, ALKPHOS, BILITOT in the last 168 hours. Hematology Recent Labs  Lab 02/27/17 0603  WBC 6.3  RBC 3.99  HGB 11.7*  HCT 35.6*  MCV 89.2  MCH 29.3  MCHC 32.9  RDW 12.6  PLT 243   Cardiac EnzymesNo results for input(s): TROPONINI in the last 168 hours.  Recent Labs  Lab 02/27/17 0617 02/27/17 0843   TROPIPOC 0.00 0.00    BNPNo results for input(s): BNP, PROBNP in the last 168 hours.  DDimer No results for input(s): DDIMER in the last 168 hours.  Radiology/Studies:  Dg Chest Portable 1 View  Result Date: 02/27/2017 CLINICAL DATA:  Acute onset of generalized chest pain. EXAM: PORTABLE CHEST 1 VIEW COMPARISON:  None. FINDINGS: The lungs are well-aerated and clear. There is no evidence of focal opacification, pleural effusion or pneumothorax. The cardiomediastinal silhouette is within normal limits. No acute osseous abnormalities are seen. IMPRESSION: No acute cardiopulmonary process seen. Electronically Signed   By: Roanna RaiderJeffery  Chang M.D.   On: 02/27/2017 06:18    Assessment and Plan:   Chest pain hypertension/hyperlipidemia/nonobstructive CAD -Likely, her troponin, EKG are reassuring and she is currently chest pain-free.  Her symptoms included no sharp chest pain, pressure.  No change with water.  Could this have been GI related?  LDL is.  She usually exercises without any significant difficulty.  Thankfully, she has had an extensive cardiac evaluation recently with cardiac catheterization in August 2016 demonstrating only ostial 30% circumflex.  Chest x-ray reassuring as well.  Reassuring ejection fraction.  Her exam today is normal, normal affect, regular rate and rhythm no murmurs rubs or gallops no JVD lungs are clear normal respiratory effort no lower extremity edema ambulating well./Tobacco use/given her conclusion of symptoms, I feel comfortable allowing her to be discharged with [facility] [place].  Continue to encourage tobacco cessation as well as lipid control and blood pressure control.  Her medications were reviewed.  Continue with diabetes with hypertension control as well.  Her blood pressure was quite elevated when EMS arrived, and the 200 systolic range.  Certainly anxiety could been playing a role as well.  Most recent blood pressure here is  mildly elevated at 145/88.  Comfortable with DC home with no further cardiac testing.   For questions or updates, please contact [facility] Please consult [email or web address] for contact info under Cardiology/STEMI.  Signed, Donato Schultz, MD  02/27/2017 11:40 AM

## 2017-02-28 LAB — HIV ANTIBODY (ROUTINE TESTING W REFLEX): HIV Screen 4th Generation wRfx: NONREACTIVE

## 2018-05-28 IMAGING — DX DG CHEST 1V PORT
1 series · 1 of 1 positions shown · non-contrast
Comparison: None.

CLINICAL DATA: Acute onset of generalized chest pain.

EXAM:
PORTABLE CHEST 1 VIEW

[chest ap]
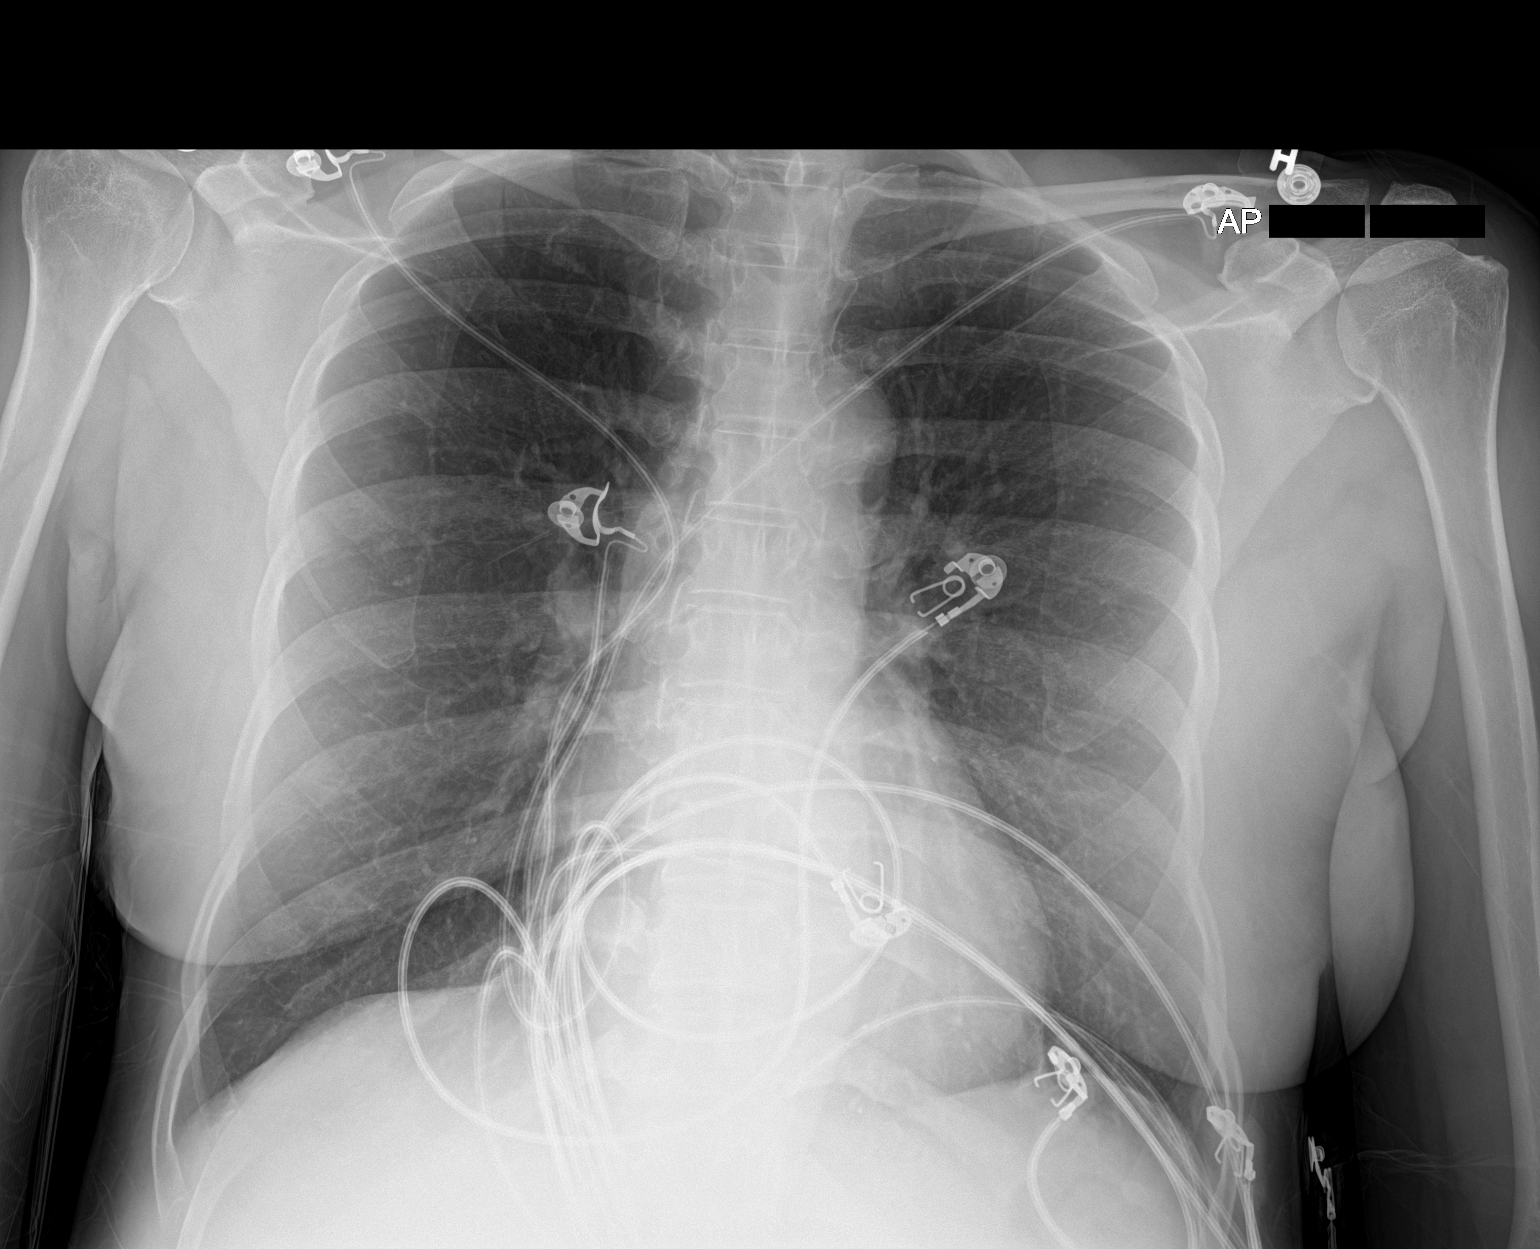

[1 of 1 positions shown; findings below may reference images not displayed]

FINDINGS: The lungs are well-aerated and clear. There is no evidence of focal
opacification, pleural effusion or pneumothorax.

The cardiomediastinal silhouette is within normal limits. No acute
osseous abnormalities are seen.
IMPRESSION: No acute cardiopulmonary process seen.
